# Patient Record
Sex: Female | Born: 1985 | Race: White | Hispanic: No | Marital: Married | Smoking: Former smoker
Health system: Southern US, Community
[De-identification: ages and names within clinical notes are randomized; demographics above are authoritative.]

## PROBLEM LIST (undated history)

## (undated) HISTORY — PX: NO PAST SURGERIES: SHX2092

---

## 2005-10-20 ENCOUNTER — Emergency Department: Payer: Self-pay | Admitting: Emergency Medicine

## 2005-10-29 ENCOUNTER — Emergency Department: Payer: Self-pay | Admitting: Emergency Medicine

## 2006-02-13 ENCOUNTER — Emergency Department: Payer: Self-pay | Admitting: Emergency Medicine

## 2006-03-06 ENCOUNTER — Emergency Department: Payer: Self-pay | Admitting: Emergency Medicine

## 2007-10-15 ENCOUNTER — Emergency Department: Payer: Self-pay | Admitting: Emergency Medicine

## 2007-11-26 ENCOUNTER — Emergency Department: Payer: Self-pay | Admitting: Internal Medicine

## 2008-12-05 ENCOUNTER — Inpatient Hospital Stay: Payer: Self-pay | Admitting: Obstetrics and Gynecology

## 2009-06-07 IMAGING — US US OB < 14 WEEKS - US OB TV
1 series · 17 of 28 positions shown · non-contrast
Comparison: none

REASON FOR EXAM: Vaginal bleeding
COMMENTS:

[Series 1: us ob < 14 weeks - us ob tv · 17 of 35 slices shown]
[im 1/35]
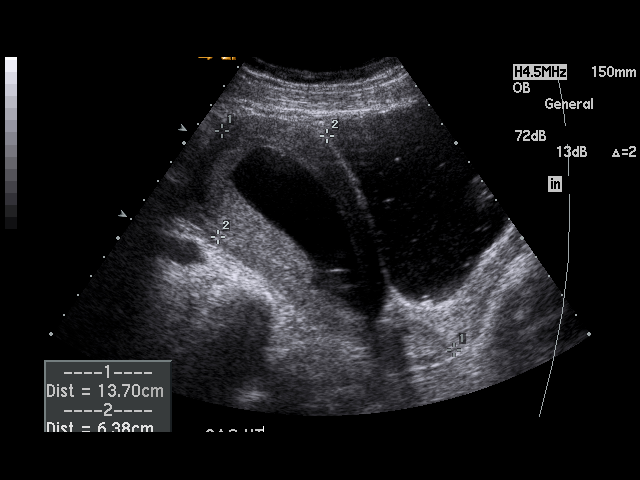
[im 3/35]
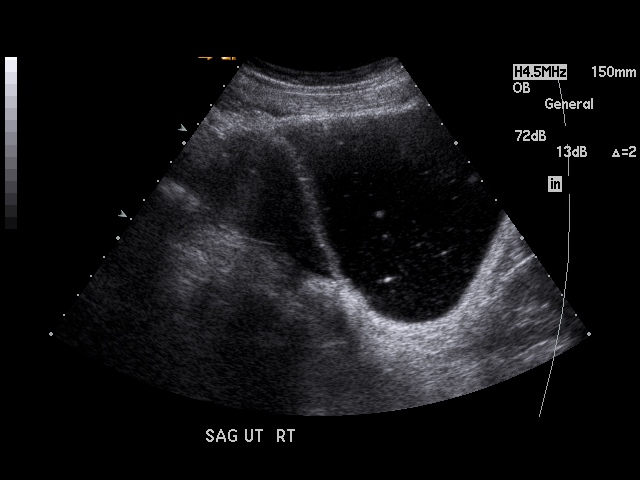
[im 6/35]
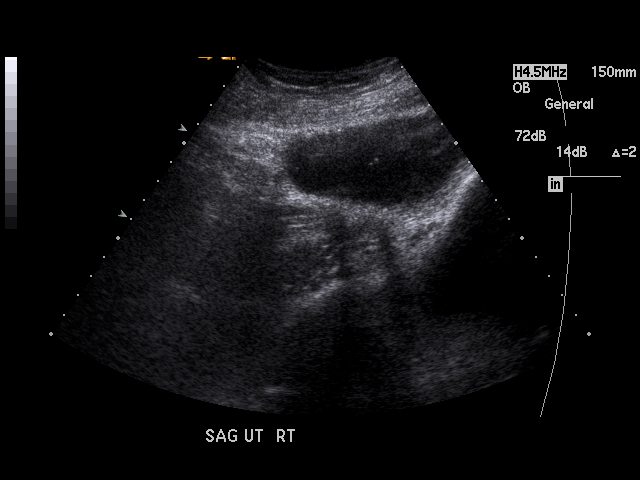
[im 7/35]
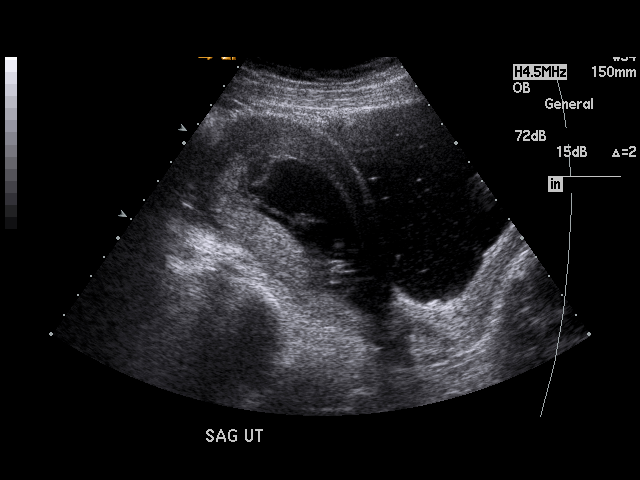
[im 9/35]
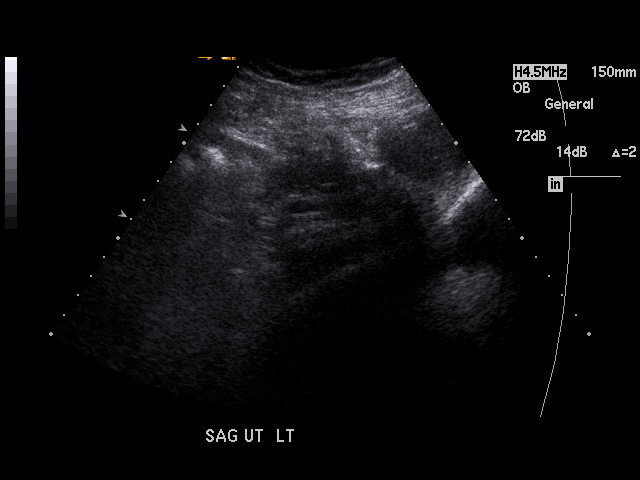
[im 12/35]
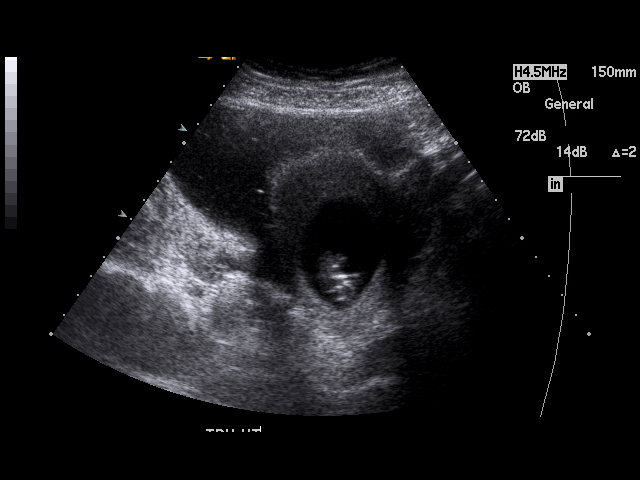
[im 13/35]
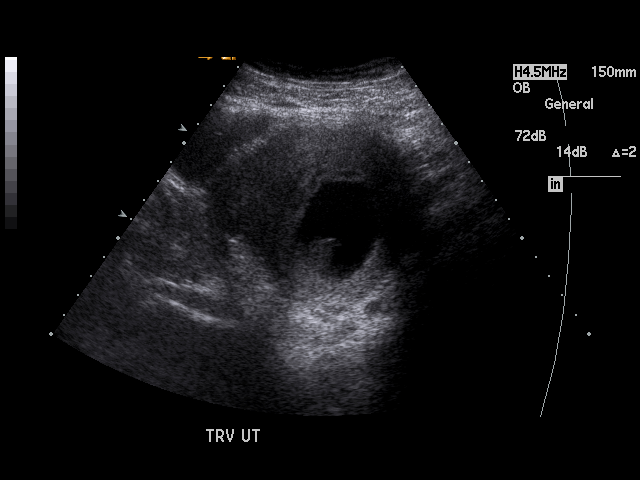
[im 16/35]
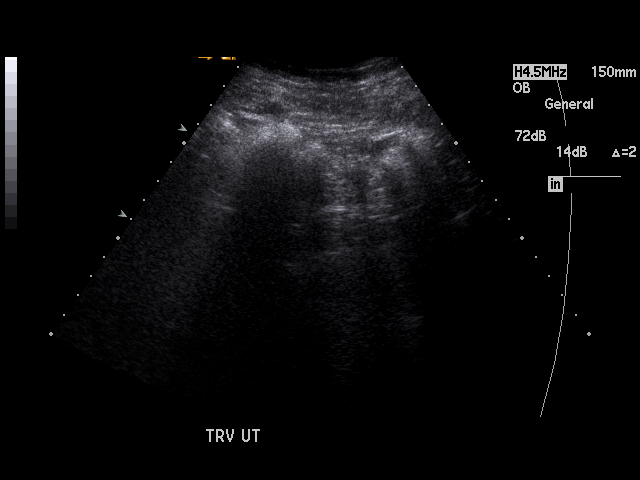
[im 18/35]
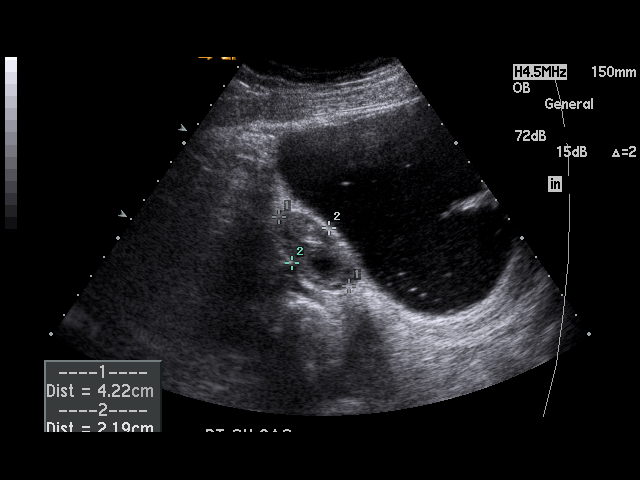
[im 19/35]
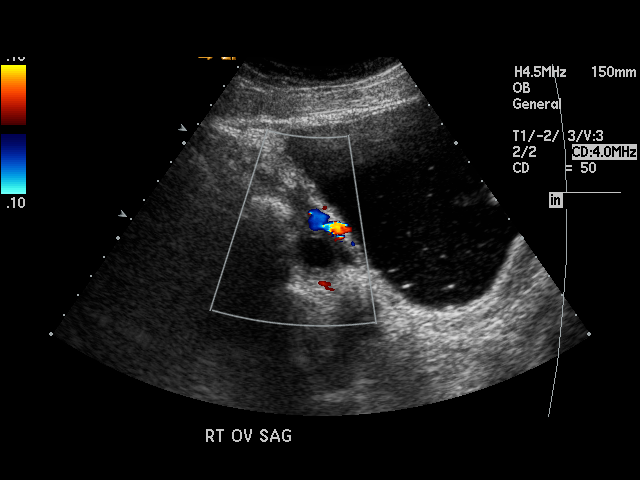
[im 22/35]
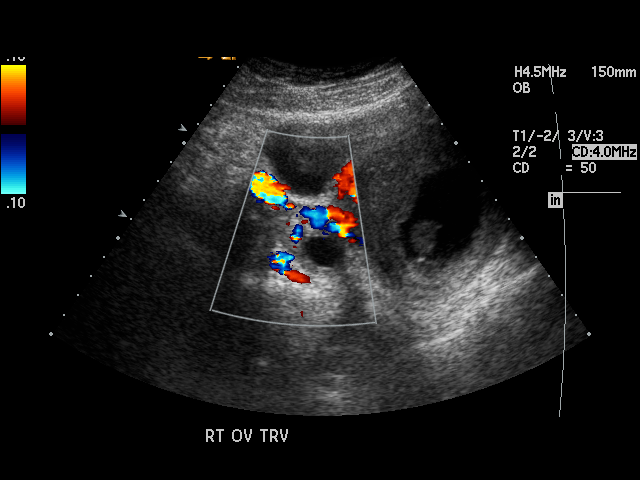
[im 23/35]
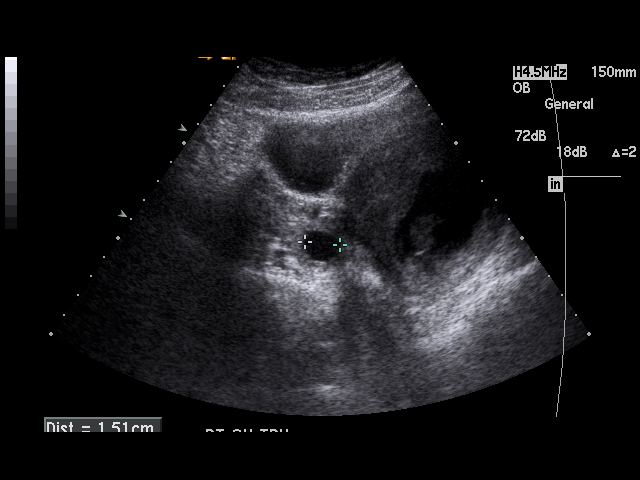
[im 26/35]
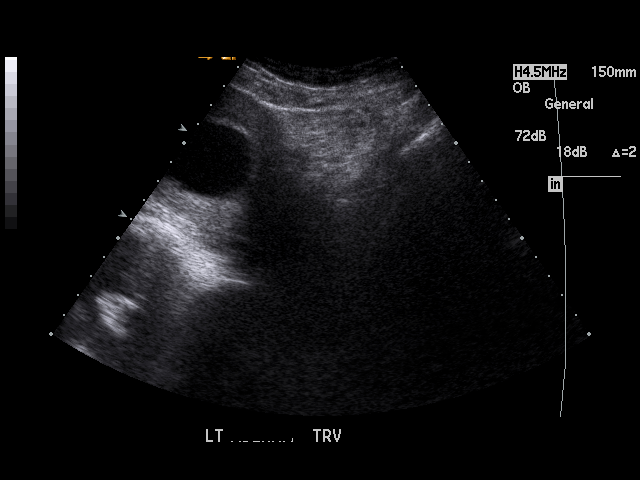
[im 28/35]
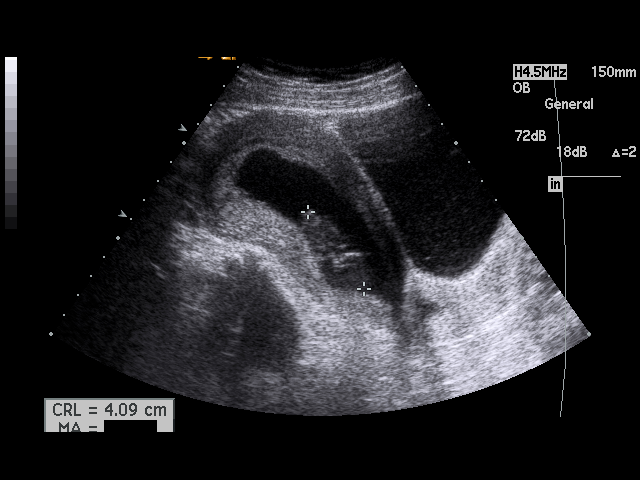
[im 29/35]
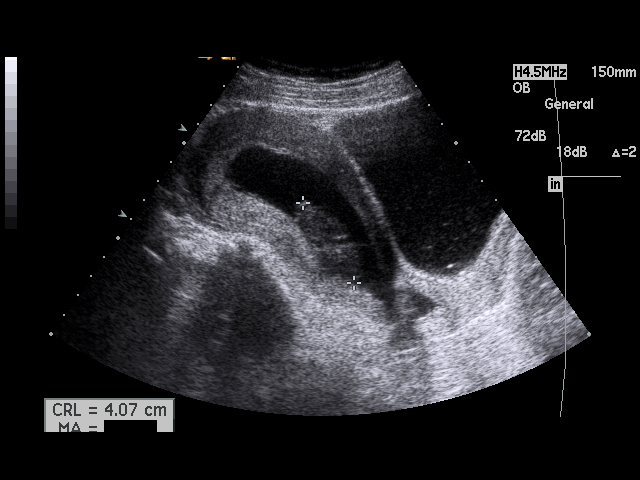
[im 32/35]
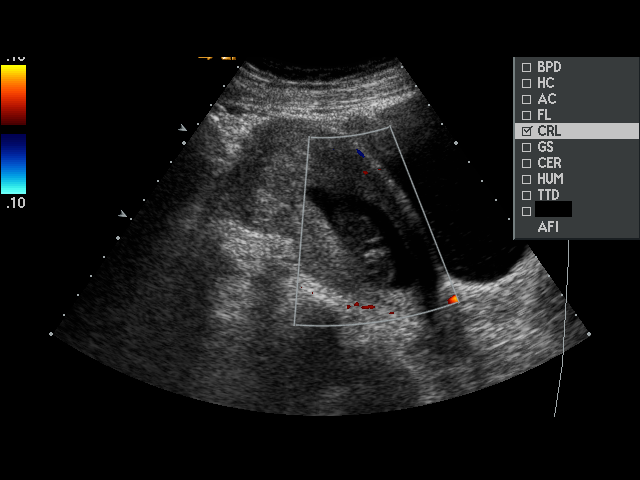
[im 35/35]
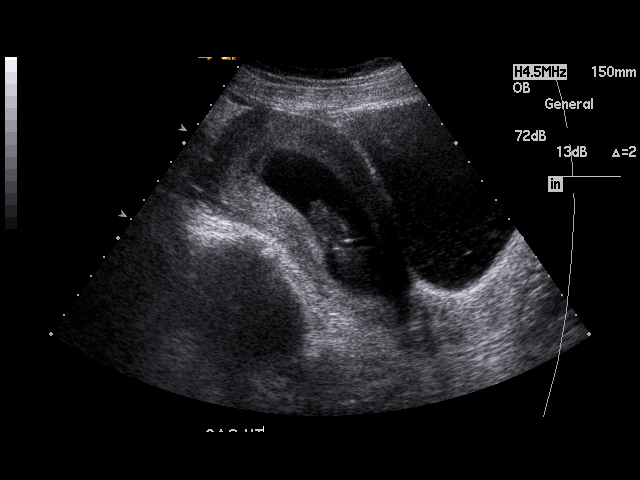

[17 of 28 positions shown; findings below may reference images not displayed]

PROCEDURE:     US  - US OB LESS THAN 14 WEEKS/W TRANS  - November 26, 2007  [DATE]

RESULT:     The patient is undergoing evaluation for possible miscarriage.

There is an IUP present but no cardiac activity is demonstrated. The
crown-rump length measures 4.14 cm, corresponding to an approximately 11
week, 0 day gestation. This is in reasonable agreement with the patient's
clinical dating. The maternal LEFT ovary could not be demonstrated. The
maternal RIGHT ovary measures 4.2 x 2.2 x 3.0 cm and contains an
approximately 1.7 cm in diameter simple appearing cyst. No free fluid is
identified in the cul-de-sac.
IMPRESSION: 1.  There are findings consistent with fetal demise. No cardiac activity is
demonstrated. The fetal size is consistent with the menstrual dating at
approximately 11 weeks. Follow-up ultrasound would be useful.
2.  The LEFT ovary could not be demonstrated. The RIGHT ovary contains a
simple appearing cyst.

This report was called to the [HOSPITAL] the conclusion of the
study.

## 2010-09-08 ENCOUNTER — Observation Stay: Payer: Self-pay | Admitting: Obstetrics and Gynecology

## 2010-12-25 ENCOUNTER — Inpatient Hospital Stay: Payer: Self-pay

## 2015-02-15 ENCOUNTER — Other Ambulatory Visit: Payer: Self-pay | Admitting: Obstetrics and Gynecology

## 2015-02-15 DIAGNOSIS — N644 Mastodynia: Secondary | ICD-10-CM

## 2015-02-15 DIAGNOSIS — N63 Unspecified lump in unspecified breast: Secondary | ICD-10-CM

## 2015-02-16 ENCOUNTER — Ambulatory Visit
Admission: RE | Admit: 2015-02-16 | Discharge: 2015-02-16 | Disposition: A | Discharge status: Home / Self Care | Payer: Managed Care, Other (non HMO) | Source: Ambulatory Visit | Attending: Obstetrics and Gynecology | Admitting: Obstetrics and Gynecology

## 2015-02-16 DIAGNOSIS — N63 Unspecified lump in unspecified breast: Secondary | ICD-10-CM

## 2015-02-16 DIAGNOSIS — N644 Mastodynia: Secondary | ICD-10-CM | POA: Diagnosis present

## 2015-02-22 ENCOUNTER — Ambulatory Visit: Payer: Managed Care, Other (non HMO)

## 2015-12-21 IMAGING — US US BREAST LTD UNI LEFT INC AXILLA
1 series · 5 of 5 positions shown · non-contrast
Comparison: None.

CLINICAL DATA: 28-year-old female presenting for a painful palpable
abnormality in the lower outer quadrant of the left breast. The
painful lump arose on [REDACTED] of this week. The patient started
antibiotics yesterday, which so far have not changed her symptoms.

EXAM:
ULTRASOUND OF THE LEFT BREAST

[Series 1: us breast ltd uni left inc axilla · 0.06mm/px · 5 of 5 slices shown]
[im 1/5]
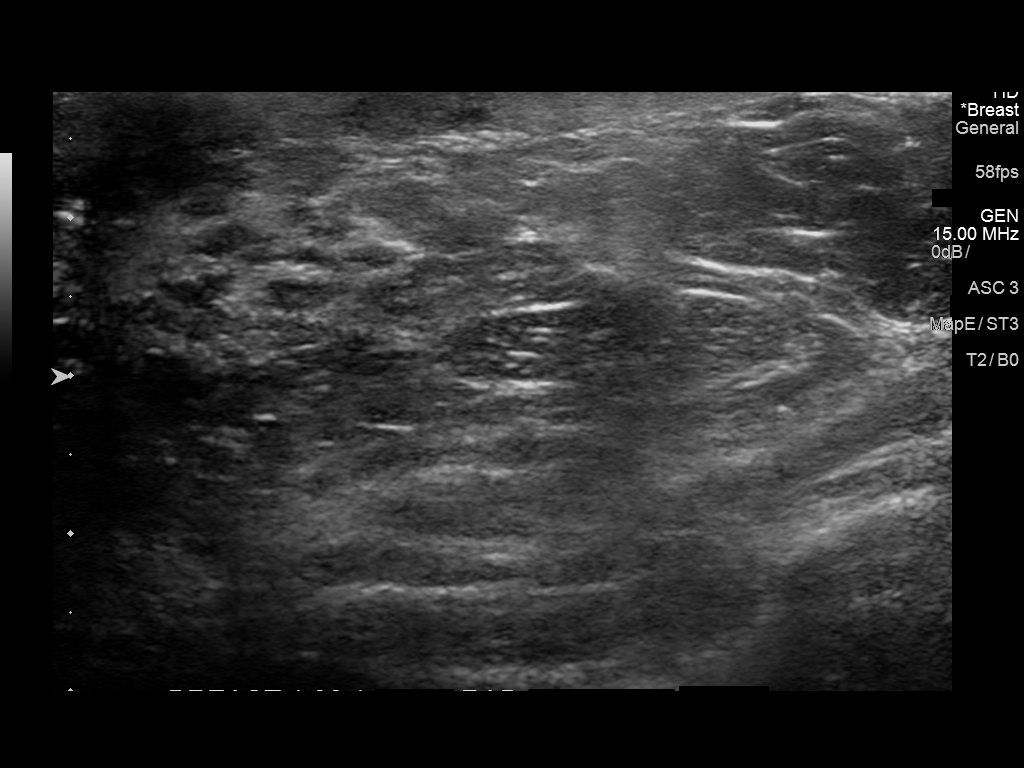
[im 2/5]
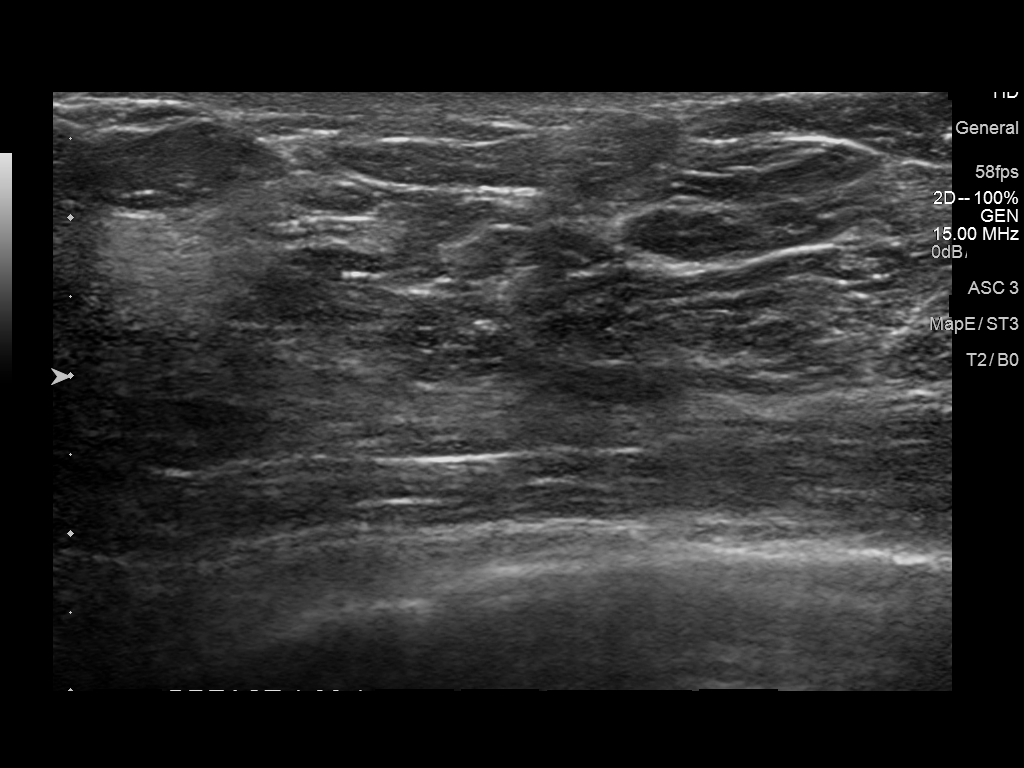
[im 3/5]
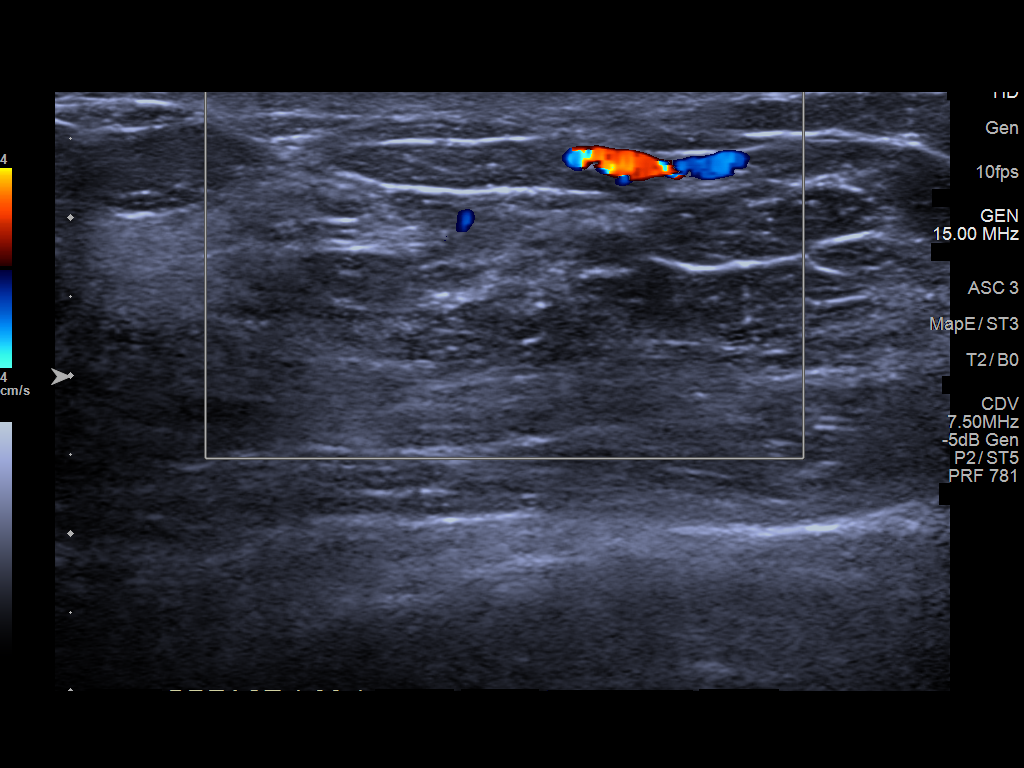
[im 4/5]
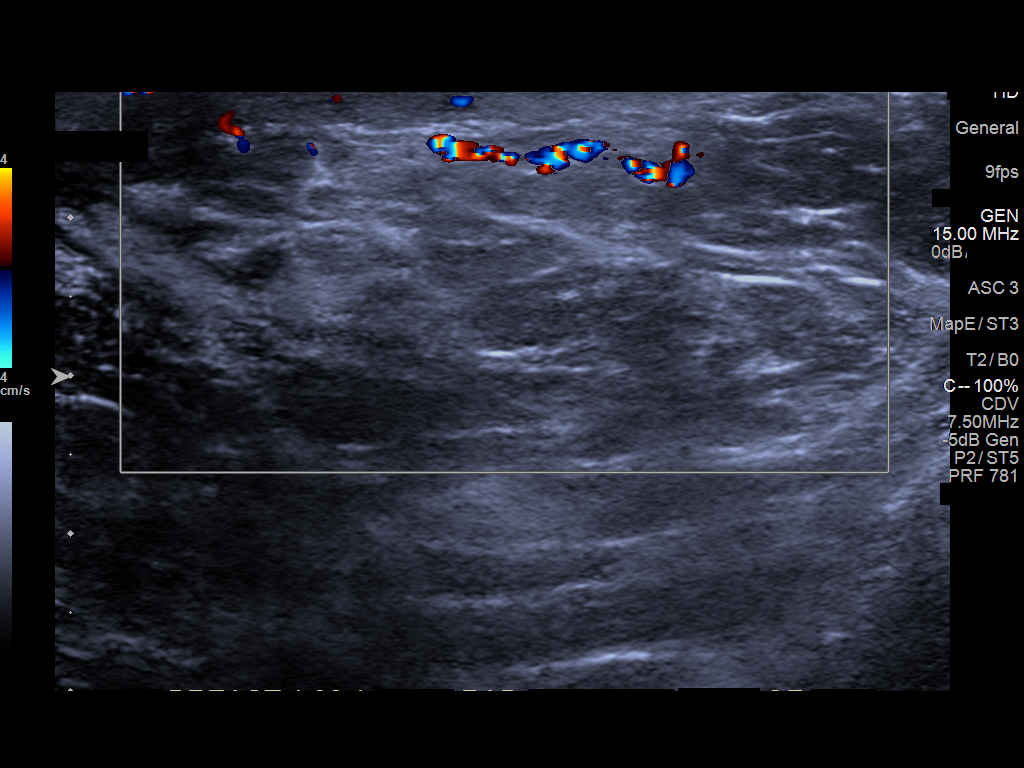
[im 5/5]
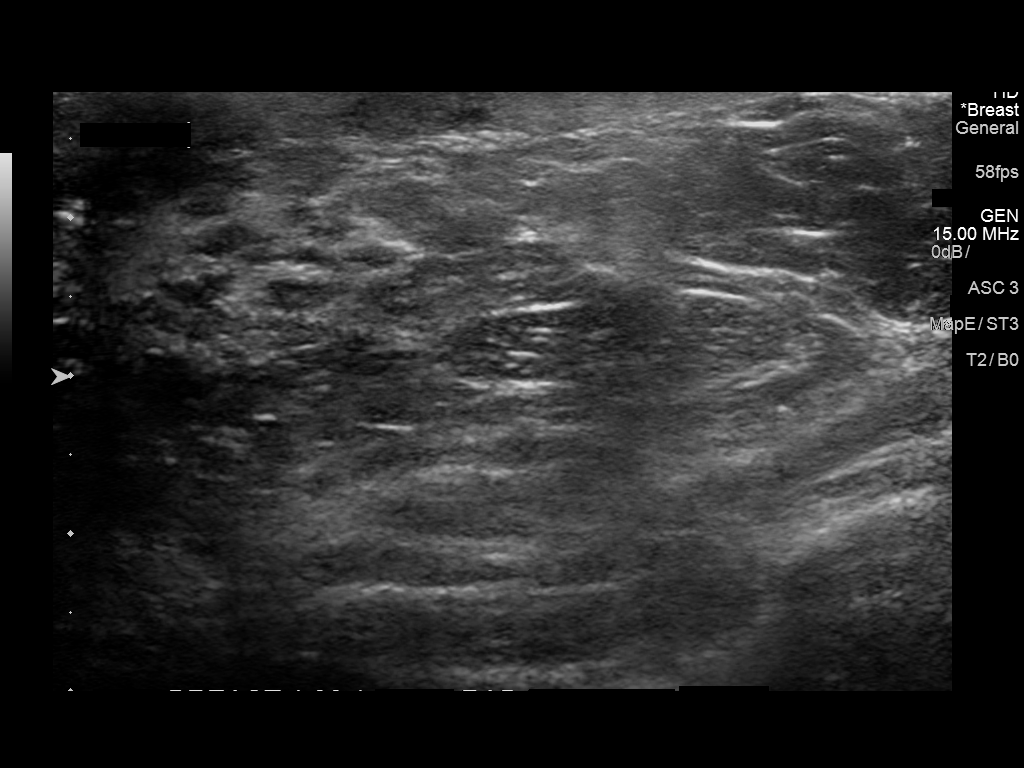

[5 of 5 positions shown; findings below may reference images not displayed]

FINDINGS: On physical exam, thickening without discrete palpable mass is
identified. The periareolar skin in the lower outer quadrant appears
erythematous.

Targeted ultrasound is performed, showing normal fibroglandular
tissue. The overlying skin appears edematous. No masses, abscesses
or sebaceous cysts are identified.
IMPRESSION: Skin edema without suspicious masses or abscesses in the lower outer
quadrant of the left breast. This may represent a focal cellulitis.

RECOMMENDATION:
1. Continued treatment plan with clinical follow-up to ensure
resolution of the red painful area in the lower outer quadrant.

2. Screening mammogram at age 39 unless there are persistent or
intervening clinical concerns. (Code:OV-4-JTB)

I have discussed the findings and recommendations with the patient.
Results were also provided in writing at the conclusion of the
visit. If applicable, a reminder letter will be sent to the patient
regarding the next appointment.

BI-RADS CATEGORY  2: Benign Finding(s)

## 2018-10-19 ENCOUNTER — Ambulatory Visit
Admission: EM | Admit: 2018-10-19 | Discharge: 2018-10-19 | Disposition: A | Discharge status: Home / Self Care | Payer: Commercial Managed Care - PPO | Attending: Emergency Medicine | Admitting: Emergency Medicine

## 2018-10-19 ENCOUNTER — Other Ambulatory Visit: Payer: Self-pay

## 2018-10-19 ENCOUNTER — Encounter: Payer: Self-pay | Admitting: Emergency Medicine

## 2018-10-19 DIAGNOSIS — Z23 Encounter for immunization: Secondary | ICD-10-CM | POA: Diagnosis not present

## 2018-10-19 DIAGNOSIS — L03115 Cellulitis of right lower limb: Secondary | ICD-10-CM | POA: Diagnosis not present

## 2018-10-19 MED ORDER — SULFAMETHOXAZOLE-TRIMETHOPRIM 800-160 MG PO TABS: 1.0000 | ORAL_TABLET | Freq: Two times a day (BID) | ORAL | 0 refills | Status: AC

## 2018-10-19 MED ORDER — MUPIROCIN 2 % EX OINT: TOPICAL_OINTMENT | CUTANEOUS | 0 refills | Status: AC

## 2018-10-19 MED ORDER — TETANUS-DIPHTH-ACELL PERTUSSIS 5-2.5-18.5 LF-MCG/0.5 IM SUSP
0.5000 mL | Freq: Once | INTRAMUSCULAR | Status: AC
  Administered 2018-10-19: 10:00:00 0.5 mL via INTRAMUSCULAR

## 2018-10-19 NOTE — ED Triage Notes (Signed)
Pt c/o right foot pain, swelling and redness. She states that she was bit by a black ant. The bite occurred 2 days ago. Swelling and redness got worse when she went to work today and was on her feet. She states her foot feels like it is asleep and itches.

## 2018-10-19 NOTE — Discharge Instructions (Addendum)
Take medication as prescribed. Rest. Drink plenty of fluids. Elevate leg. Monitor.   Follow up with your primary care physician this week as needed.  Return to urgent care as needed.  Proceed directly to the emergency room for worsening concerns.

## 2018-10-19 NOTE — ED Provider Notes (Signed)
[facility] ____________________________________________  Time seen: Approximately 9:48 AM  I have reviewed the triage vital signs and the nursing notes.   HISTORY  Chief Complaint Insect Bite (right foot)   HPI Cassandra Miranda is a 33 y.o. female presenting for evaluation of redness and swelling to right foot present for the last 2 days gradually worsening.  Patient reports Sunday she was outside working in her garden and felt a bite.  States she took off her shoe and found a black ant in her shoe that had bitten her.  Denies any other insect bites.  States she had some mild redness of the inside of her foot then, but reports it has gradually increased over the last 2 days.  States foot has a burning stinging pain to the side near the bite.  States today noticed swelling.  Has elevated without resolution.  Denies other alleviating measures.  Has continue remain ambulatory.  Denies pain radiation or discomfort in the remainder of the leg.  Denies history of MRSA.  Denies others at home with similar.  Denies other insect bites.  Denies cough, congestion, chest pain, shortness of breath, fevers or other complaints.  Continues eat and drink well.  Patient's last menstrual period was 09/29/2018 (approximate).Denies pregnancy.     History reviewed. No pertinent past medical history. Denies   There are no active problems to display for this patient.   Past Surgical History:  Procedure Laterality Date  . NO PAST SURGERIES        Current Facility-Administered Medications:  .  Tdap (BOOSTRIX) injection 0.5 mL, 0.5 mL, Intramuscular, Once, Renford DillsMiller, Bernise Sylvain, NP  Current Outpatient Medications:  .  mupirocin ointment (BACTROBAN) 2 %, Apply two times a day for 10 days., Disp: 22 g, Rfl: 0 .  sulfamethoxazole-trimethoprim (BACTRIM DS) 800-160 MG tablet, Take 1 tablet by mouth 2 (two) times daily for 10 days., Disp: 20 tablet, Rfl: 0  Allergies Penicillins  Family History   Problem Relation Age of Onset  . Breast cancer Paternal Grandmother 30  . Healthy Mother   . COPD Father     Social History Social History   Tobacco Use  . Smoking status: Former Games developermoker  . Smokeless tobacco: Never Used  Substance Use Topics  . Alcohol use: Yes  . Drug use: Not Currently    Review of Systems Constitutional: No fever ENT: No sore throat. Cardiovascular: Denies chest pain. Respiratory: Denies shortness of breath. Gastrointestinal: No abdominal pain.  Musculoskeletal: Negative for back pain.  Positive right foot pain. Skin: Positive rash  ____________________________________________   PHYSICAL EXAM:  VITAL SIGNS: ED Triage Vitals  Enc Vitals Group     BP 10/19/18 0927 125/83     Pulse Rate 10/19/18 0927 74     Resp 10/19/18 0927 16     Temp 10/19/18 0927 98.6 F (37 C)     Temp Source 10/19/18 0927 Oral     SpO2 10/19/18 0927 99 %     Weight 10/19/18 0923 177 lb (80.3 kg)     Height 10/19/18 0923 5\' 6"  (1.676 m)     Head Circumference --      Peak Flow --      Pain Score 10/19/18 0922 1     Pain Loc --      Pain Edu? --      Excl. in GC? --     Constitutional: Alert and oriented. Well appearing and in no acute distress. ENT      Head:  Normocephalic and atraumatic. Cardiovascular: Normal rate, regular rhythm. Grossly normal heart sounds.  Good peripheral circulation. Respiratory: Normal respiratory effort without tachypnea nor retractions. Breath sounds are clear and equal bilaterally. No wheezes, rales, rhonchi. Musculoskeletal: Steady gait.  Bilateral pedal pulses equal and easily palpated. Neurologic:  Normal speech and language. No gross focal neurologic deficits are appreciated. Speech is normal. No gait instability.  Skin:  Skin is warm, dry.  Except: Right lateral foot extending to right lateral ankle erythema with associated mild swelling with right lateral foot punctum, mild tenderness to direct palpation, no point bony tenderness, full  range of motion present, normal distal sensation and capillary refill to right foot, right lower extremity otherwise nontender, bilateral pedal pulses equal, erythema is not circumferential, no fluctuance, no drainage. Psychiatric: Mood and affect are normal. Speech and behavior are normal. Patient exhibits appropriate insight and judgment   ___________________________________________   LABS (all labs ordered are listed, but only abnormal results are displayed)  Labs Reviewed - No data to display ____________________________________________   PROCEDURES Procedures    INITIAL IMPRESSION / ASSESSMENT AND PLAN / ED COURSE  Pertinent labs & imaging results that were available during my care of the patient were reviewed by me and considered in my medical decision making (see chart for details).  Well-appearing patient.  Right lateral foot cellulitis from ant bite.  Tetanus immunization updated.  Will start patient on oral Bactrim and topical Bactroban.  Discussed elevation, rest and supportive care.  Patient works at [employer] and has to work on her feet all day, work note given.  Discussed strict follow-up and return parameters.Discussed indication, risks and benefits of medications with patient.  Discussed follow up with Primary care physician this week. Discussed follow up and return parameters including no resolution or any worsening concerns. Patient verbalized understanding and agreed to plan.   ____________________________________________   FINAL CLINICAL IMPRESSION(S) / ED DIAGNOSES  Final diagnoses:  Cellulitis of right foot     ED Discharge Orders         Ordered    sulfamethoxazole-trimethoprim (BACTRIM DS) 800-160 MG tablet  2 times daily     10/19/18 0939    mupirocin ointment (BACTROBAN) 2 %     10/19/18 2111           Note: This dictation was prepared with Dragon dictation along with smaller phrase technology. Any transcriptional errors that result from this  process are unintentional.         Renford Dills, NP 10/19/18 [phone]

## 2018-10-21 ENCOUNTER — Telehealth: Payer: Self-pay | Admitting: Emergency Medicine

## 2018-10-21 NOTE — Telephone Encounter (Signed)
Patient brought in FMLA paper work from visit on 05/26 and requested it to be given to Harriett Sine, Charity fundraiser (Facilities manager). Advised patient that she was out until Monday. Paperwork placed in Nancy's box outside of her door.
